# Patient Record
Sex: Female | Born: 1995 | Race: Black or African American | Hispanic: No | Marital: Single | Smoking: Current some day smoker
Health system: Southern US, Community
[De-identification: ages and names within clinical notes are randomized; demographics above are authoritative.]

---
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
## 2019-08-20 ENCOUNTER — Emergency Department: Payer: Medicaid Other

## 2019-08-20 ENCOUNTER — Emergency Department
Admission: EM | Admit: 2019-08-20 | Discharge: 2019-08-20 | Disposition: A | Discharge status: Home / Self Care | Payer: Medicaid Other | Attending: Emergency Medicine | Admitting: Emergency Medicine

## 2019-08-20 ENCOUNTER — Encounter: Payer: Self-pay | Admitting: Emergency Medicine

## 2019-08-20 ENCOUNTER — Other Ambulatory Visit: Payer: Self-pay

## 2019-08-20 DIAGNOSIS — F17210 Nicotine dependence, cigarettes, uncomplicated: Secondary | ICD-10-CM | POA: Insufficient documentation

## 2019-08-20 DIAGNOSIS — W540XXA Bitten by dog, initial encounter: Secondary | ICD-10-CM | POA: Insufficient documentation

## 2019-08-20 DIAGNOSIS — S6991XA Unspecified injury of right wrist, hand and finger(s), initial encounter: Secondary | ICD-10-CM | POA: Diagnosis present

## 2019-08-20 DIAGNOSIS — Y929 Unspecified place or not applicable: Secondary | ICD-10-CM | POA: Diagnosis not present

## 2019-08-20 DIAGNOSIS — Y998 Other external cause status: Secondary | ICD-10-CM | POA: Insufficient documentation

## 2019-08-20 DIAGNOSIS — S61451A Open bite of right hand, initial encounter: Secondary | ICD-10-CM | POA: Insufficient documentation

## 2019-08-20 DIAGNOSIS — Y93K1 Activity, walking an animal: Secondary | ICD-10-CM | POA: Insufficient documentation

## 2019-08-20 MED ORDER — AMOXICILLIN-POT CLAVULANATE 875-125 MG PO TABS: 1.0000 | ORAL_TABLET | Freq: Two times a day (BID) | ORAL | 0 refills | Status: AC

## 2019-08-20 MED ORDER — MELOXICAM 15 MG PO TABS: 15.0000 mg | ORAL_TABLET | Freq: Every day | ORAL | 1 refills | Status: AC

## 2019-08-20 MED ORDER — OXYCODONE-ACETAMINOPHEN 5-325 MG PO TABS
1.0000 | ORAL_TABLET | Freq: Once | ORAL | Status: AC
  Administered 2019-08-20: 1 via ORAL
  Filled 2019-08-20 (×2): qty 1

## 2019-08-20 MED ORDER — ONDANSETRON 4 MG PO TBDP
4.0000 mg | ORAL_TABLET | Freq: Once | ORAL | Status: AC
Start: 2019-08-20 — End: 2019-08-20
  Administered 2019-08-20: 4 mg via ORAL
  Filled 2019-08-20: qty 1

## 2019-08-20 NOTE — Discharge Instructions (Addendum)
Make sure dog is available to be quarantined by [facility] please. Please take Augmentin twice daily for the next 10 days. If you have any redness or streaking surrounding wound sites, please return to the emergency department.

## 2019-08-20 NOTE — ED Provider Notes (Signed)
[facility] Emergency Department Provider Note  ____________________________________________  Time seen: Approximately 3:22 PM  I have reviewed the triage vital signs and the nursing notes.   HISTORY  Chief Complaint Animal Bite    HPI Teresa Contreras is a 23 y.o. female presents to the emergency department after an acute dog bite that occurred today.  Patient states that she was walking her foster dog when another dog being walked attacked her.  Patient states that dog is available to be quarantined for observation. Patient has been able to move all five right digits. She reports that her tetanus status is up-to-date.  No similar injuries in the past.  No other alleviating measures have been attempted.        History reviewed. No pertinent past medical history.  There are no active problems to display for this patient.   History reviewed. No pertinent surgical history.  Prior to Admission medications   Medication Sig Start Date End Date Taking? Authorizing Provider  sulfamethoxazole-trimethoprim (BACTRIM DS,SEPTRA DS) 800-160 MG tablet Take 1 tablet by mouth 2 (two) times daily. 08/09/16  Yes Cuthriell, Delorise Royals, PA-C  amoxicillin-clavulanate (AUGMENTIN) 875-125 MG tablet Take 1 tablet by mouth 2 (two) times daily for 10 days. 08/20/19 08/30/19  Orvil Feil, PA-C  amoxicillin-clavulanate (AUGMENTIN) 875-125 MG tablet Take 1 tablet by mouth 2 (two) times daily for 10 days. 08/20/19 08/30/19  Orvil Feil, PA-C  meloxicam (MOBIC) 15 MG tablet Take 1 tablet (15 mg total) by mouth daily for 7 days. 08/20/19 08/27/19  Orvil Feil, PA-C    Allergies Patient has no known allergies.  No family history on file.  Social History Social History   Tobacco Use  . Smoking status: Current Some Day Smoker    Types: Cigarettes  . Smokeless tobacco: Never Used  . Tobacco comment: quit 2 weeks ago  Substance Use Topics  . Alcohol use: No  . Drug use: Not  on file     Review of Systems  Constitutional: No fever/chills Eyes: No visual changes. No discharge ENT: No upper respiratory complaints. Cardiovascular: no chest pain. Respiratory: no cough. No SOB. Gastrointestinal: No abdominal pain.  No nausea, no vomiting.  No diarrhea.  No constipation. Musculoskeletal: Patient has bilateral hand pain.  Skin: Patient has dog bite wounds of right hand.  Neurological: Negative for headaches, focal weakness or numbness.  ____________________________________________   PHYSICAL EXAM:  VITAL SIGNS: ED Triage Vitals  Enc Vitals Group     BP 08/20/19 1444 129/84     Pulse Rate 08/20/19 1444 (!) 119     Resp 08/20/19 1444 18     Temp 08/20/19 1444 98.1 F (36.7 C)     Temp Source 08/20/19 1444 Oral     SpO2 08/20/19 1444 97 %     Weight 08/20/19 1444 110 lb (49.9 kg)     Height 08/20/19 1444 5\' 5"  (1.651 m)     Head Circumference --      Peak Flow --      Pain Score 08/20/19 1451 10     Pain Loc --      Pain Edu? --      Excl. in GC? --      Constitutional: Alert and oriented. Well appearing and in no acute distress. Eyes: Conjunctivae are normal. PERRL. EOMI. Head: Atraumatic. Cardiovascular: Normal rate, regular rhythm. Normal S1 and S2.  Good peripheral circulation. Respiratory: Normal respiratory effort without tachypnea or retractions. Lungs CTAB. Good air  entry to the bases with no decreased or absent breath sounds. Gastrointestinal: Bowel sounds 4 quadrants. Soft and nontender to palpation. No guarding or rigidity. No palpable masses. No distention. No CVA tenderness. Musculoskeletal: Patient can perform full range of motion at the wrist bilaterally.  She can move all 10 fingers.  No flexor or extensor tendon deficits appreciated with testing.  She can perform opposition without difficulty and can spread all 10 fingers.  She can perform flexion at the IP joint of the bilateral thumbs.  Palpable radial pulse bilaterally and  symmetrically. Neurologic:  Normal speech and language. No gross focal neurologic deficits are appreciated.  Skin: Patient has multiple puncture wounds sustained to the dorsal and volar aspect of the right hand.  Psychiatric: Mood and affect are normal. Speech and behavior are normal. Patient exhibits appropriate insight and judgement.   ____________________________________________   LABS (all labs ordered are listed, but only abnormal results are displayed)  Labs Reviewed - No data to display ____________________________________________  EKG   ____________________________________________  RADIOLOGY I personally viewed and evaluated these images as part of my medical decision making, as well as reviewing the written report by the radiologist.    Dg Hand Complete Left  Result Date: 08/20/2019 CLINICAL DATA:  Says she was walking her foster dog and another person was walking their foster dog and the other dog came after hers and she had to pry the other dogs mouth open. Bpd aware. She has puncture wounds to both hands EXAM: LEFT HAND - COMPLETE 3+ VIEW COMPARISON:  None. FINDINGS: There is no evidence of fracture or dislocation. There is no evidence of arthropathy or other focal bone abnormality. Soft tissues are unremarkable. IMPRESSION: Negative. Electronically Signed   By: Norva PavlovElizabeth  Brown M.D.   On: 08/20/2019 15:43   Dg Hand Complete Right  Result Date: 08/20/2019 CLINICAL DATA:  Says she was walking her foster dog and another person was walking their foster dog and the other dog came after hers and she had to pry the other dogs mouth open. Bpd aware. She has puncture wounds to both hands EXAM: RIGHT HAND - COMPLETE 3+ VIEW COMPARISON:  None. FINDINGS: There is soft tissue swelling along the volar aspect of the second digit adjacent to the middle phalanx. No acute fracture. No radiopaque foreign body or soft tissue gas. IMPRESSION: Soft tissue swelling. No fracture. Electronically  Signed   By: Norva PavlovElizabeth  Brown M.D.   On: 08/20/2019 15:42    ____________________________________________    PROCEDURES  Procedure(s) performed:    Procedures  LACERATION REPAIR Performed by: Orvil FeilJaclyn M Tieasha Larsen Authorized by: Orvil FeilJaclyn M Jaylinn Hellenbrand Consent: Verbal consent obtained. Risks and benefits: risks, benefits and alternatives were discussed Consent given by: patient Patient identity confirmed: provided demographic data Prepped and Draped in normal sterile fashion Wound explored  Laceration Location: Right middle finger   Laceration Length: 1 cm  No Foreign Bodies seen or palpated  Anesthesia: local infiltration  Irrigation method: syringe Amount of cleaning: standard  Skin closure: Dermabond   Patient tolerance: Patient tolerated the procedure well with no immediate complications.   Medications  oxyCODONE-acetaminophen (PERCOCET/ROXICET) 5-325 MG per tablet 1 tablet (1 tablet Oral Given 08/20/19 1530)  ondansetron (ZOFRAN-ODT) disintegrating tablet 4 mg (4 mg Oral Given 08/20/19 1525)     ____________________________________________   INITIAL IMPRESSION / ASSESSMENT AND PLAN / ED COURSE  Pertinent labs & imaging results that were available during my care of the patient were reviewed by me and considered in my  medical decision making (see chart for details).  Review of the [place] CSRS was performed in accordance of the [place] prior to dispensing any controlled drugs.           Assessment and Plan:  Dog bite:  23 year old female presents to the emergency department after sustaining puncture wounds to the right hand after being bitten by a dog.  Patient was mildly tachycardic at triage and seemed anxious in exam room.  She was able to move all 10 fingers and had no flexor or extensor tendon deficits appreciated with testing.  Differential diagnosis includes fracture, flexor tendon injury, dog bite wound..  No bony abnormality or retained teeth were identified on  x-rays of patient's bilateral hands.  Dermabond was placed over puncture wound at volar aspect of right third digit.  Patient was discharged with Augmentin.  She reports her tetanus status is up-to-date.  Patient education regarding rabies vaccination series was given and patient opted to have dog quarantined.     ____________________________________________  FINAL CLINICAL IMPRESSION(S) / ED DIAGNOSES  Final diagnoses:  Dog bite, initial encounter      NEW MEDICATIONS STARTED DURING THIS VISIT:  ED Discharge Orders         Ordered    amoxicillin-clavulanate (AUGMENTIN) 875-125 MG tablet  2 times daily     08/20/19 1608    meloxicam (MOBIC) 15 MG tablet  Daily     08/20/19 1610    amoxicillin-clavulanate (AUGMENTIN) 875-125 MG tablet  2 times daily     08/20/19 1610              This chart was dictated using voice recognition software/Dragon. Despite best efforts to proofread, errors can occur which can change the meaning. Any change was purely unintentional.    Lannie Fields, PA-C 08/20/19 1618    Duffy Bruce, MD 08/23/19 [phone]

## 2019-08-20 NOTE — ED Notes (Signed)
Says she was walking her foster dog and another person was walking their foster dog and the other dog came after hers and she had to pry the other dogs mouth open.  Bpd aware.  She has puncture wounds.

## 2019-08-20 NOTE — ED Notes (Signed)
Pt bitten by someone's foster dog when it attacked her dog.

## 2019-08-20 NOTE — ED Triage Notes (Signed)
Dog bite right second and 3rd fingers.  Was another persons dog.

## 2021-03-26 IMAGING — DX DG HAND COMPLETE 3+V*R*
3 series · 3 of 3 positions shown · non-contrast
Comparison: None.

CLINICAL DATA: Says she was walking her [place] and another
person was walking their [place] and the other dog came after
hers and she had to pry the other dogs mouth open. Bpd aware. She
has puncture wounds to both hands

EXAM:
RIGHT HAND - COMPLETE 3+ VIEW

[hand ap]
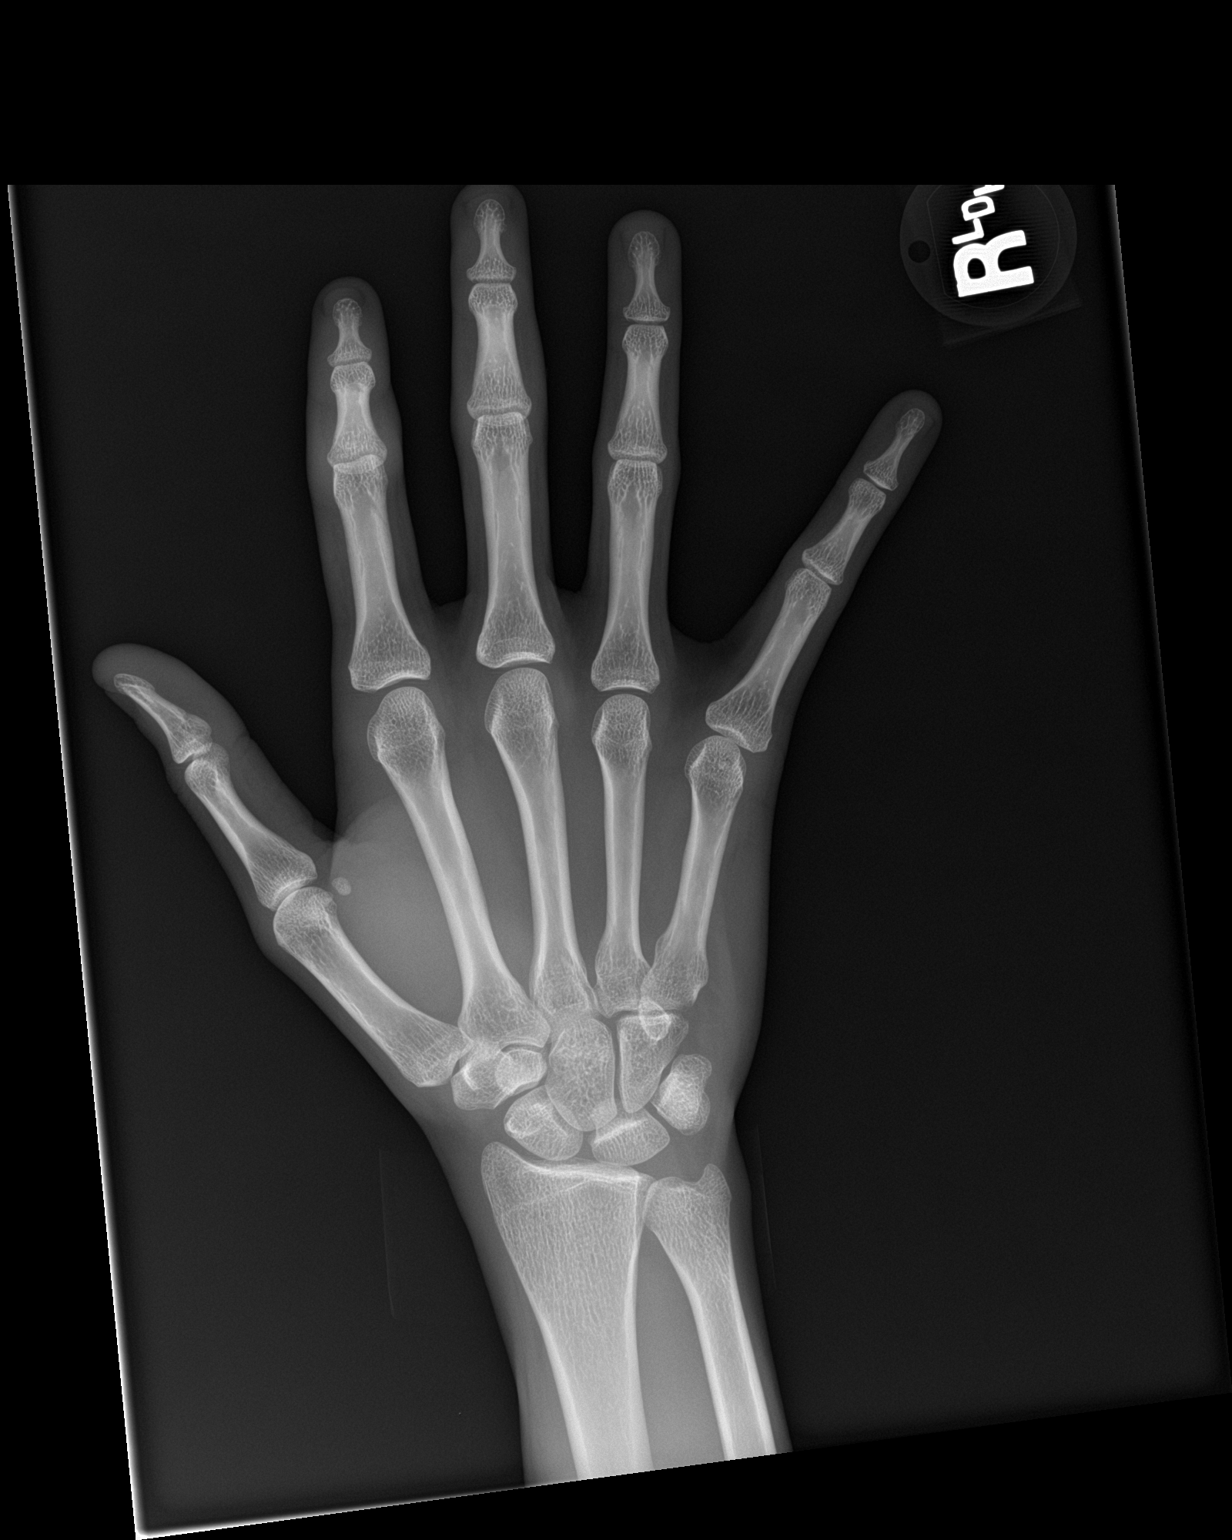

[hand obl]
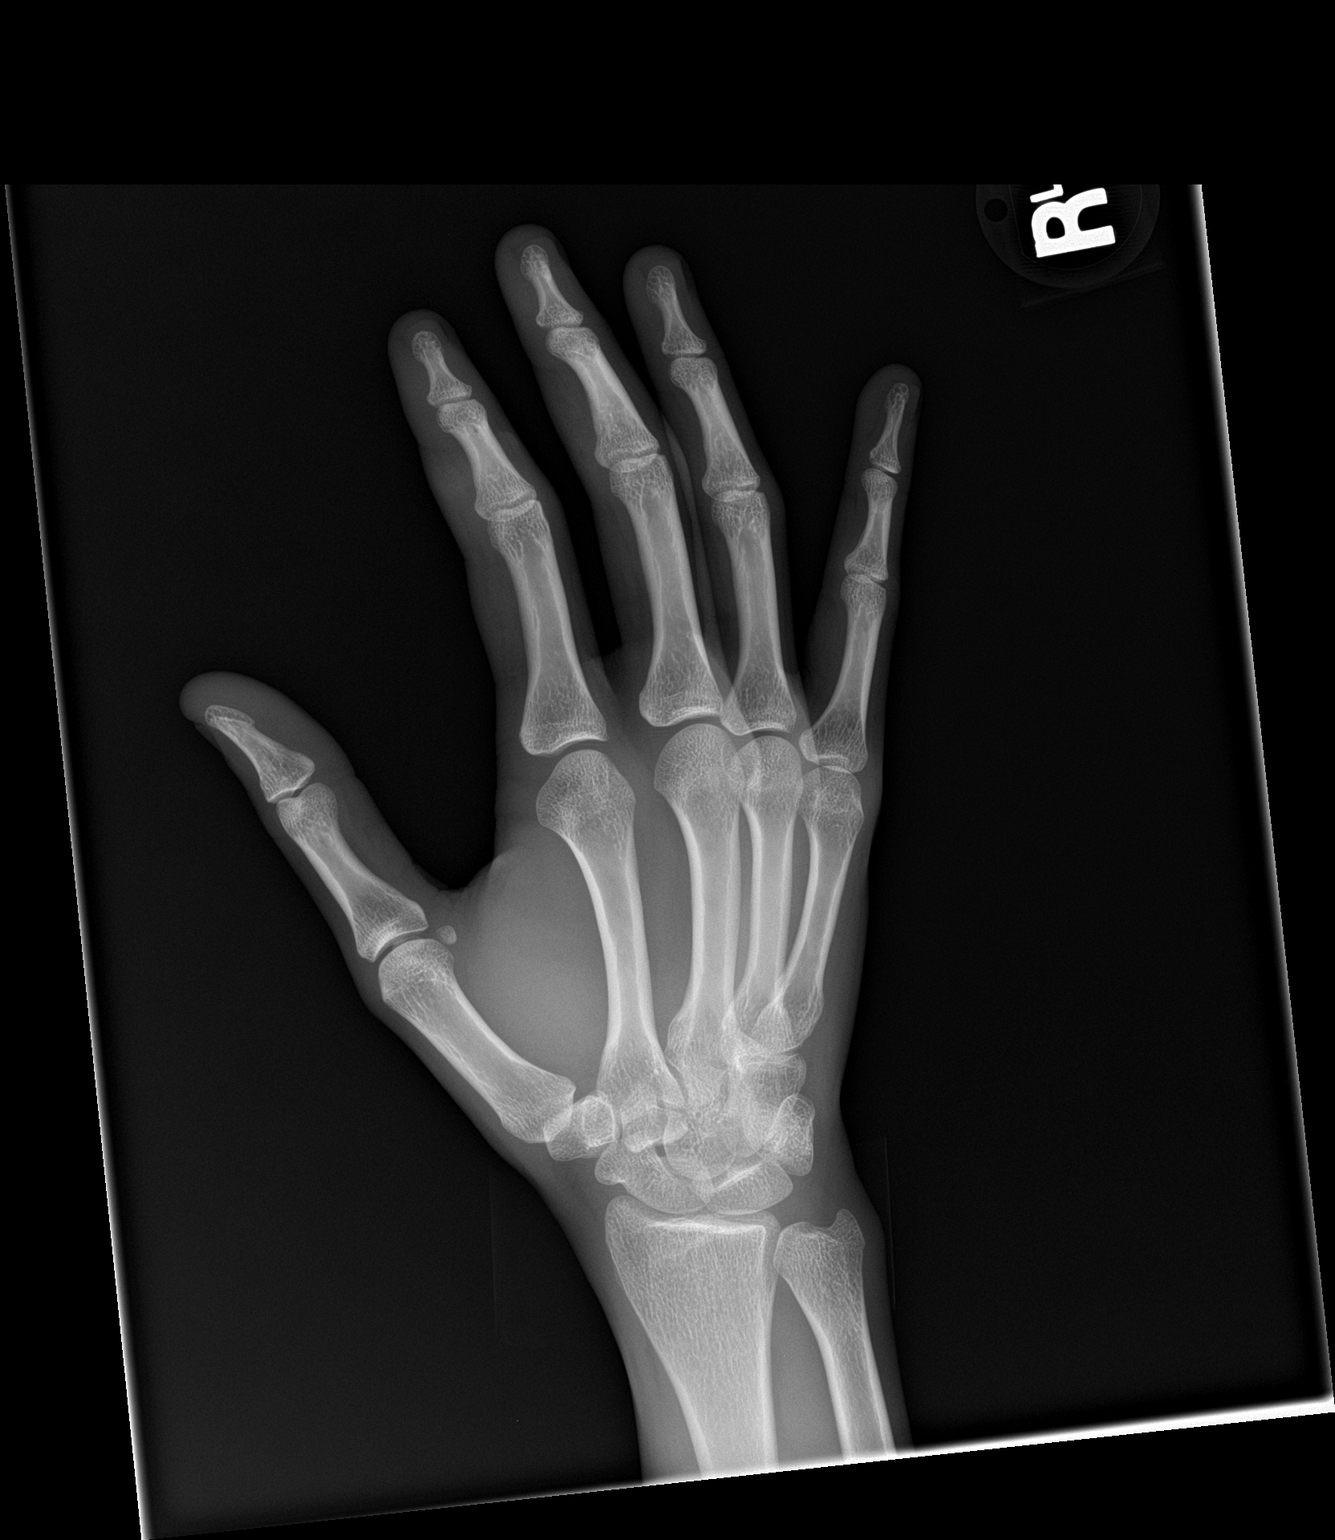

[hand lat]
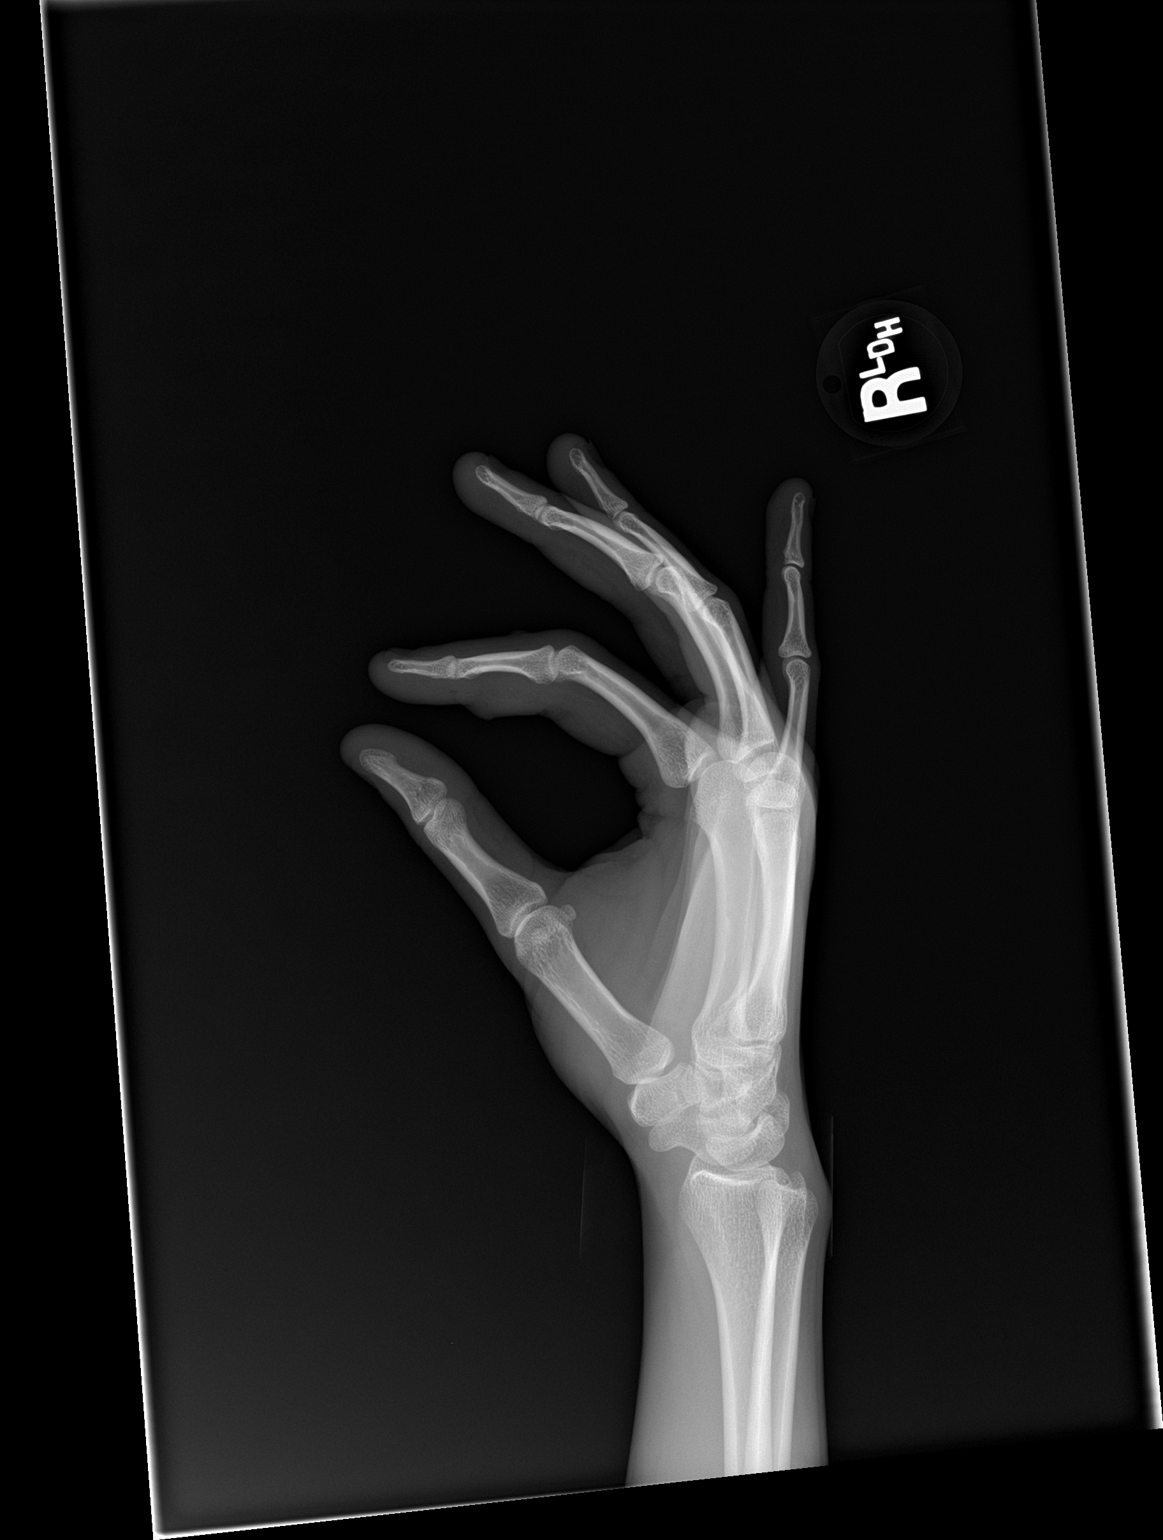

[3 of 3 positions shown; findings below may reference images not displayed]

FINDINGS: There is soft tissue swelling along the volar aspect of the second
digit adjacent to the middle phalanx. No acute fracture. No
radiopaque foreign body or soft tissue gas.
IMPRESSION: Soft tissue swelling. No fracture.
